# Patient Record
Sex: Female | Born: 1985 | Race: White | Hispanic: No | Marital: Single | Smoking: Current every day smoker
Health system: Southern US, Community
[De-identification: ages and names within clinical notes are randomized; demographics above are authoritative.]

## PROBLEM LIST (undated history)

## (undated) HISTORY — PX: ANKLE SURGERY: SHX546

---

## 2016-04-09 ENCOUNTER — Emergency Department (HOSPITAL_COMMUNITY)
Admission: EM | Admit: 2016-04-09 | Discharge: 2016-04-09 | Disposition: A | Discharge status: Home / Self Care | Payer: Self-pay | Attending: Emergency Medicine | Admitting: Emergency Medicine

## 2016-04-09 ENCOUNTER — Encounter (HOSPITAL_COMMUNITY): Payer: Self-pay

## 2016-04-09 DIAGNOSIS — Z711 Person with feared health complaint in whom no diagnosis is made: Secondary | ICD-10-CM | POA: Insufficient documentation

## 2016-04-09 NOTE — ED Provider Notes (Signed)
CSN: [id number]     Arrival date & time 04/09/16  1148 History   First MD Initiated Contact with Patient 04/09/16 1359     Chief Complaint  Patient presents with  . v74.5      (Consider location/radiation/quality/duration/timing/severity/associated sxs/prior Treatment) HPI Comments: Patient here after possible STI exposure. She is currently asymptomatic. She is here with her partner who was just seen in the department and treated for possible STI although he also doesn't have any symptoms. She denies any vaginal bleeding or discharge. No dysuria. No flank pain or abdominal discomfort. Her partner did have some lymphadenopathy however he denies any penile drainage or dysuria. He did have a urine STI panel sent.  The history is provided by the patient.    History reviewed. No pertinent past medical history. History reviewed. No pertinent past surgical history. No family history on file. Social History  Substance Use Topics  . Smoking status: Never Smoker   . Smokeless tobacco: None  . Alcohol Use: None   OB History    No data available     Review of Systems  All other systems reviewed and are negative.     Allergies  Review of patient's allergies indicates not on file.  Home Medications   Prior to Admission medications   Not on File   BP 115/67 mmHg  Pulse 75  Temp(Src) 98.2 F (36.8 C) (Oral)  Resp 18  SpO2 100%  LMP 04/09/2016 Physical Exam  Constitutional: She is oriented to person, place, and time. She appears well-developed and well-nourished.  Non-toxic appearance. No distress.  HENT:  Head: Normocephalic and atraumatic.  Eyes: Conjunctivae, EOM and lids are normal. Pupils are equal, round, and reactive to light.  Neck: Normal range of motion. Neck supple. No tracheal deviation present. No thyroid mass present.  Cardiovascular: Normal rate, regular rhythm and normal heart sounds.  Exam reveals no gallop.   No murmur heard. Pulmonary/Chest: Effort normal  and breath sounds normal. No stridor. No respiratory distress. She has no decreased breath sounds. She has no wheezes. She has no rhonchi. She has no rales.  Abdominal: Soft. Normal appearance and bowel sounds are normal. She exhibits no distension. There is no tenderness. There is no rebound and no CVA tenderness.  Musculoskeletal: Normal range of motion. She exhibits no edema or tenderness.  Neurological: She is alert and oriented to person, place, and time. She has normal strength. No cranial nerve deficit or sensory deficit. GCS eye subscore is 4. GCS verbal subscore is 5. GCS motor subscore is 6.  Skin: Skin is warm and dry. No abrasion and no rash noted.  Psychiatric: She has a normal mood and affect. Her speech is normal and behavior is normal.  Nursing note and vitals reviewed.   ED Course  Procedures (including critical care time) Labs Review Labs Reviewed - No data to display  Imaging Review No results found. I have personally reviewed and evaluated these images and lab results as part of my medical decision-making.   EKG Interpretation None      MDM   Final diagnoses:  None    Pelvic exam deferred per patient. Patient has no clinical indication for treatment of STI at this time. Will follow-up with her physician and given return precautions.    Lorre NickAnthony Ciji Boston, MD 04/09/16 1410

## 2016-04-09 NOTE — Discharge Instructions (Signed)
Follow-up in the health Department if you should start to have vaginal discharge, burning when you peak, or abdominal discomfort

## 2016-04-09 NOTE — ED Notes (Signed)
Patient here requesting tx for STD since her significant other was just treated. No complaints

## 2018-01-09 ENCOUNTER — Encounter (HOSPITAL_COMMUNITY): Payer: Self-pay | Admitting: Emergency Medicine

## 2018-01-09 ENCOUNTER — Other Ambulatory Visit: Payer: Self-pay

## 2018-01-09 ENCOUNTER — Ambulatory Visit (HOSPITAL_COMMUNITY)
Admission: EM | Admit: 2018-01-09 | Discharge: 2018-01-09 | Disposition: A | Discharge status: Home / Self Care | Payer: Managed Care, Other (non HMO) | Attending: Family Medicine | Admitting: Family Medicine

## 2018-01-09 DIAGNOSIS — R202 Paresthesia of skin: Secondary | ICD-10-CM | POA: Diagnosis not present

## 2018-01-09 MED ORDER — NAPROXEN 500 MG PO TABS: 500.0000 mg | ORAL_TABLET | Freq: Two times a day (BID) | ORAL | 0 refills | Status: AC

## 2018-01-09 NOTE — Discharge Instructions (Signed)
Naproxen twice a day, take with food. Ice of wrists after work. Splints to both wrists at least at night while sleeping, may be beneficial while working as well. Please follow up with occupational health. Please establish with a primary care provider as well as may need long term management or further evaluation if symptoms persist.

## 2018-01-09 NOTE — ED Provider Notes (Signed)
[facility]    CSN: [id number] Arrival date & time: 01/09/18  1328     History   Chief Complaint Chief Complaint  Patient presents with  . Hand Problem    HPI Stephanie Jacobson is a 32 y.o. female.   Stephanie Jacobson presents with complaints of bilateral hand tingling sensation. Started approximately 1 week ago. Worse at night. Affects thumbs, pointer finger, middle finger and left ring finger. Pinky is not involved. Palm at base of thumb with pain intermittently. Hands feel stiff. Without gross numbness. Work also exacerbates symptoms. Occasionally with pain that shoots up to left elbow. Without shoulder or neck pain. 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 Has taken aleve occasionally which sometimes helps, last dose last night. She is right handed. Does not have a pcp. Denies any previous similar.    ROS per HPI.       History reviewed. No pertinent past medical history.  There are no active problems to display for this patient.   Past Surgical History:  Procedure Laterality Date  . ANKLE SURGERY Left     OB History    No data available       Home Medications    Prior to Admission medications   Medication Sig Start Date End Date Taking? Authorizing Provider  naproxen (NAPROSYN) 500 MG tablet Take 1 tablet (500 mg total) by mouth 2 (two) times daily. 01/09/18   Georgetta HaberBurky, Tammey Deeg B, NP    Family History Family History  Problem Relation Age of Onset  . Hypertension Mother   . Diabetes Mother     Social History Social History   Tobacco Use  . Smoking status: Current Every Day Smoker  Substance Use Topics  . Alcohol use: Yes  . Drug use: No     Allergies   Patient has no known allergies.   Review of Systems Review of Systems   Physical Exam Triage Vital Signs ED Triage Vitals  Enc Vitals Group     BP 01/09/18 1406 (!) 105/55     Pulse Rate 01/09/18 1406 95      Resp 01/09/18 1406 18     Temp 01/09/18 1406 98.3 F (36.8 C)     Temp Source 01/09/18 1406 Oral     SpO2 01/09/18 1406 100 %     Weight --      Height --      Head Circumference --      Peak Flow --      Pain Score 01/09/18 1402 1     Pain Loc --      Pain Edu? --      Excl. in GC? --    No data found.  Updated Vital Signs BP (!) 105/55 (BP Location: Left Arm)   Pulse 95   Temp 98.3 F (36.8 C) (Oral)   Resp 18   LMP 12/12/2017   SpO2 100%   Visual Acuity Right Eye Distance:   Left Eye Distance:   Bilateral Distance:    Right Eye Near:   Left Eye Near:    Bilateral Near:     Physical Exam  Constitutional: She is oriented to person, place, and time. She appears well-developed and well-nourished. No distress.  Cardiovascular: Normal rate, regular rhythm and normal heart sounds.  Pulmonary/Chest: Effort normal and breath sounds normal.  Musculoskeletal:       Left hand: She exhibits normal range  of motion, no tenderness, no bony tenderness, normal two-point discrimination, normal capillary refill, no deformity, no laceration and no swelling. Decreased sensation noted. Decreased strength noted. She exhibits thumb/finger opposition.       Hands: Sensation of tingling as noted per diagram; gross sensation intact; moving all fingers and wrist; with making a fist patient describes a "tightness" to digits. Cap refill <2 sec; bilateral elbow ROM normal, non tender, full bilateral shoulder ROM  Neurological: She is alert and oriented to person, place, and time.  Skin: Skin is warm and dry.     UC Treatments / Results  Labs (all labs ordered are listed, but only abnormal results are displayed) Labs Reviewed - No data to display  EKG  EKG Interpretation None       Radiology No results found.  Procedures Procedures (including critical care time)  Medications Ordered in UC Medications - No data to display   Initial Impression / Assessment and Plan / UC  Course  I have reviewed the triage vital signs and the nursing notes.  Pertinent labs & imaging results that were available during my care of the patient were reviewed by me and considered in my medical decision making (see chart for details).     Works an Theatre stage manager style work at [employer], likely contributing to symptoms. Concern for carpal tunnel. Bilateral wrist splints provided, encouraged night time use, may need to use during work as well. Naproxen twice a day. Ice application. Follow up with occupational health. Establish with a PCP as may need long term management or further evaluation if symptoms persist.   Final Clinical Impressions(s) / UC Diagnoses   Final diagnoses:  Paresthesia of both hands    ED Discharge Orders        Ordered    naproxen (NAPROSYN) 500 MG tablet  2 times daily     01/09/18 1434       Controlled Substance Prescriptions [place] Controlled Substance Registry consulted? Not Applicable   Georgetta Haber, NP 01/09/18 1444

## 2018-01-09 NOTE — ED Triage Notes (Signed)
Onset 1/21 of bilateral hand issues.  Both hands with tingling sensation in fingertips. Left arm has sharp, shooting pains.  Patient feels bilateral weakness in both hands.  Patient is right handed.  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

## 2018-01-17 ENCOUNTER — Encounter (HOSPITAL_COMMUNITY): Payer: Self-pay | Admitting: Emergency Medicine

## 2018-01-17 ENCOUNTER — Emergency Department (HOSPITAL_COMMUNITY)
Admission: EM | Admit: 2018-01-17 | Discharge: 2018-01-17 | Disposition: A | Discharge status: Home / Self Care | Payer: Managed Care, Other (non HMO) | Attending: Emergency Medicine | Admitting: Emergency Medicine

## 2018-01-17 ENCOUNTER — Emergency Department (HOSPITAL_COMMUNITY): Payer: Managed Care, Other (non HMO)

## 2018-01-17 DIAGNOSIS — R200 Anesthesia of skin: Secondary | ICD-10-CM | POA: Diagnosis not present

## 2018-01-17 DIAGNOSIS — F17200 Nicotine dependence, unspecified, uncomplicated: Secondary | ICD-10-CM | POA: Diagnosis not present

## 2018-01-17 DIAGNOSIS — M79641 Pain in right hand: Secondary | ICD-10-CM | POA: Diagnosis present

## 2018-01-17 DIAGNOSIS — M79642 Pain in left hand: Secondary | ICD-10-CM | POA: Insufficient documentation

## 2018-01-17 MED ORDER — PREDNISONE 50 MG PO TABS: ORAL_TABLET | ORAL | 0 refills | Status: AC

## 2018-01-17 MED ORDER — CYCLOBENZAPRINE HCL 10 MG PO TABS: 10.0000 mg | ORAL_TABLET | Freq: Every evening | ORAL | 0 refills | Status: AC | PRN

## 2018-01-17 NOTE — ED Triage Notes (Signed)
Pt reports bilateral hand swelling and numbness for the past 3 weeks. Onset has been gradual. No acute injury.

## 2018-01-17 NOTE — Discharge Instructions (Signed)
Wear the wrist splints for comfort and support. Follow up for further evaluation. Do not take the muscle relaxer when doing anything that may cause injury because they will make you sleepy.

## 2018-01-17 NOTE — ED Provider Notes (Signed)
[facility]EMERGENCY [facility]PT Provider Note   CSN: [id number] Arrival date & time: 01/17/18  1030     History   Chief Complaint Chief Complaint  Patient presents with  . Hand Pain    HPI Stephanie Jacobson is a 32 y.o. female who presents to the ED with bilateral hand pain that started over 2 weeks ago. Patient reports going to Urgent Care and treated with wrist splints and given referral to ortho. Patient reports being unable to get an appointment. She is here today c/o worsening symptoms. Patient is taking Naprosyn without relief and states that the splint is to big and makes things worse. 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 She reports that in the morning when she get up her hands are swollen.   HPI  History reviewed. No pertinent past medical history.  There are no active problems to display for this patient.   Past Surgical History:  Procedure Laterality Date  . ANKLE SURGERY Left     OB History    No data available       Home Medications    Prior to Admission medications   Medication Sig Start Date End Date Taking? Authorizing Provider  cyclobenzaprine (FLEXERIL) 10 MG tablet Take 1 tablet (10 mg total) by mouth at bedtime as needed for muscle spasms. 01/17/18   Janne NapoleonNeese, Hope M, NP  naproxen (NAPROSYN) 500 MG tablet Take 1 tablet (500 mg total) by mouth 2 (two) times daily. 01/09/18   Georgetta HaberBurky, Natalie B, NP  predniSONE (DELTASONE) 50 MG tablet Take one tablet PO daily 01/17/18   Janne NapoleonNeese, Hope M, NP    Family History Family History  Problem Relation Age of Onset  . Hypertension Mother   . Diabetes Mother     Social History Social History   Tobacco Use  . Smoking status: Current Every Day Smoker  Substance Use Topics  . Alcohol use: Yes  . Drug use: No     Allergies   Patient has no known allergies.   Review of Systems Review of Systems  Musculoskeletal: Positive for arthralgias and joint  swelling.  Skin: Negative for wound.  All other systems reviewed and are negative.    Physical Exam Updated Vital Signs BP 128/79 (BP Location: Left Arm)   Pulse 86   Temp 98 F (36.7 C) (Oral)   Resp 15   LMP 01/16/2018 (Exact Date)   SpO2 99%   Physical Exam  Constitutional: She appears well-developed and well-nourished. No distress.  HENT:  Head: Normocephalic.  Eyes: EOM are normal.  Neck: Neck supple.  Cardiovascular: Normal rate.  Pulmonary/Chest: Effort normal.  Musculoskeletal:       Right hand: She exhibits tenderness. She exhibits normal range of motion, normal capillary refill, no deformity, no laceration and no swelling. Normal sensation noted. Normal strength noted. She exhibits no thumb/finger opposition.       Left hand: She exhibits tenderness. She exhibits normal range of motion, normal capillary refill, no deformity, no laceration and no swelling. Normal sensation noted. Normal strength noted. She exhibits no thumb/finger opposition.  Radial pulses 2+, adequate circulation, grips are equal. Tender with palpation to the dorsum of the the hands that radiates to the wrist.   Neurological: She is alert.  Skin: Skin is warm and dry.  Psychiatric: She has a normal mood and affect. Her behavior is normal.  Nursing note and vitals reviewed.    ED Treatments / Results  Labs (  all labs ordered are listed, but only abnormal results are displayed) Labs Reviewed - No data to display   Radiology Dg Wrist Complete Right  Result Date: 01/17/2018 CLINICAL DATA:  Bilateral hand pain and numbness for 3 weeks without known injury. EXAM: RIGHT WRIST - COMPLETE 3+ VIEW COMPARISON:  None. FINDINGS: There is no evidence of fracture or dislocation. There is no evidence of arthropathy or other focal bone abnormality. Soft tissues are unremarkable. IMPRESSION: Normal right wrist. Electronically Signed   By: Lupita Raider, M.D.   On: 01/17/2018 16:16    Procedures Procedures  (including critical care time)  Medications Ordered in ED Medications - No data to display   Initial Impression / Assessment and Plan / ED Course  I have reviewed the triage vital signs and the nursing notes. 32 y.o. female with bilateral hand pain that radiates to the wrist and forearm stable for d/c without focal neuro deficits. Patient given wrist splints while in the ED and states that her pain has already improved. Will treat with short course of steroids and patient to f/u with ortho for further evaluation.   Final Clinical Impressions(s) / ED Diagnoses   Final diagnoses:  Bilateral hand pain    ED Discharge Orders        Ordered    predniSONE (DELTASONE) 50 MG tablet     01/17/18 1631    cyclobenzaprine (FLEXERIL) 10 MG tablet  At bedtime PRN     01/17/18 1631       Damian Leavell [place], NP 01/17/18 2251    Bethann Berkshire, MD 01/18/18 1256

## 2019-09-07 IMAGING — DX DG WRIST COMPLETE 3+V*R*
4 series · 4 of 4 positions shown · non-contrast
Comparison: None.

CLINICAL DATA: Bilateral hand pain and numbness for 3 weeks without
known injury.

EXAM:
RIGHT WRIST - COMPLETE 3+ VIEW

[wrist ap (1 of 2)]
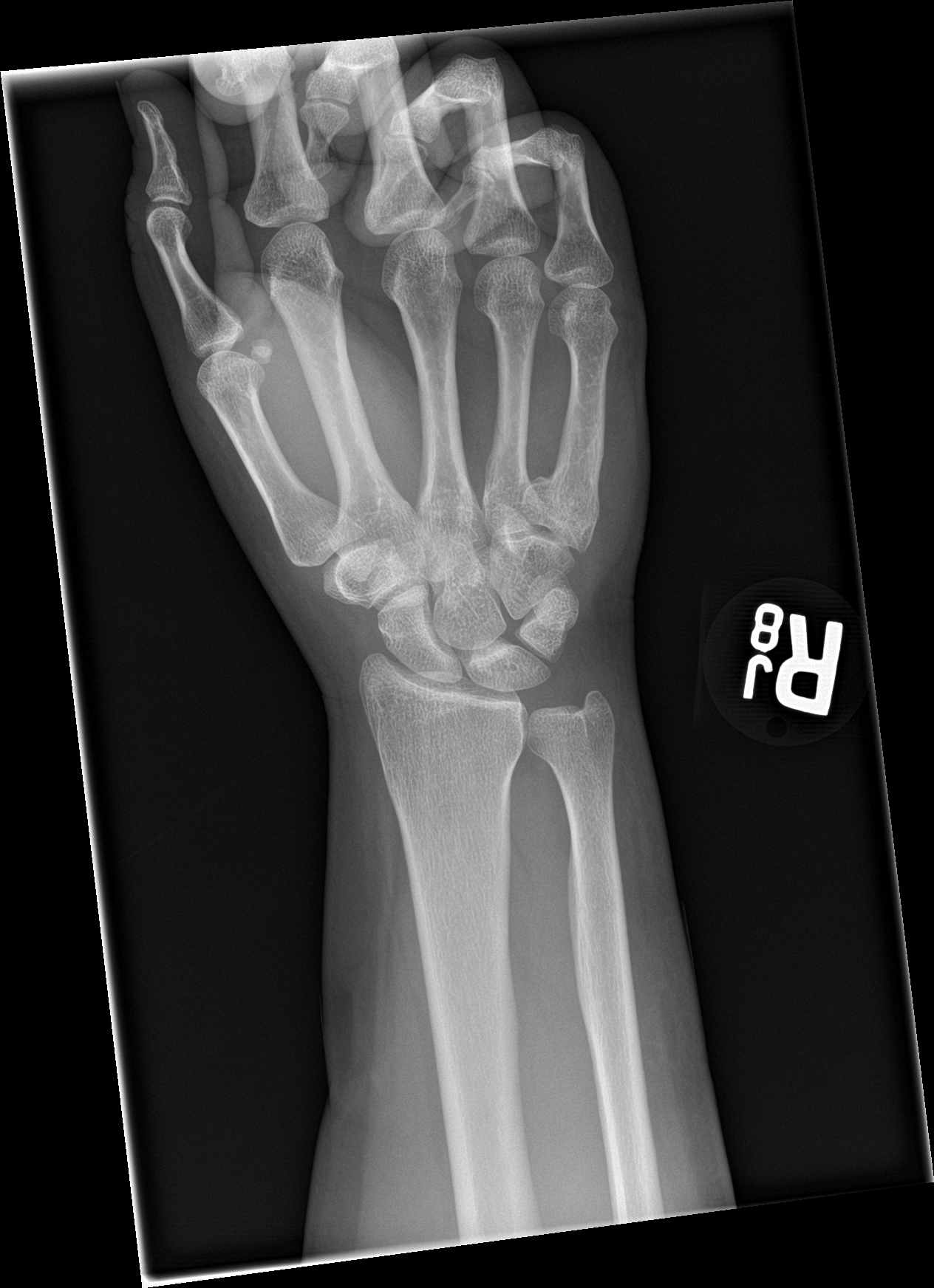

[wrist obl]
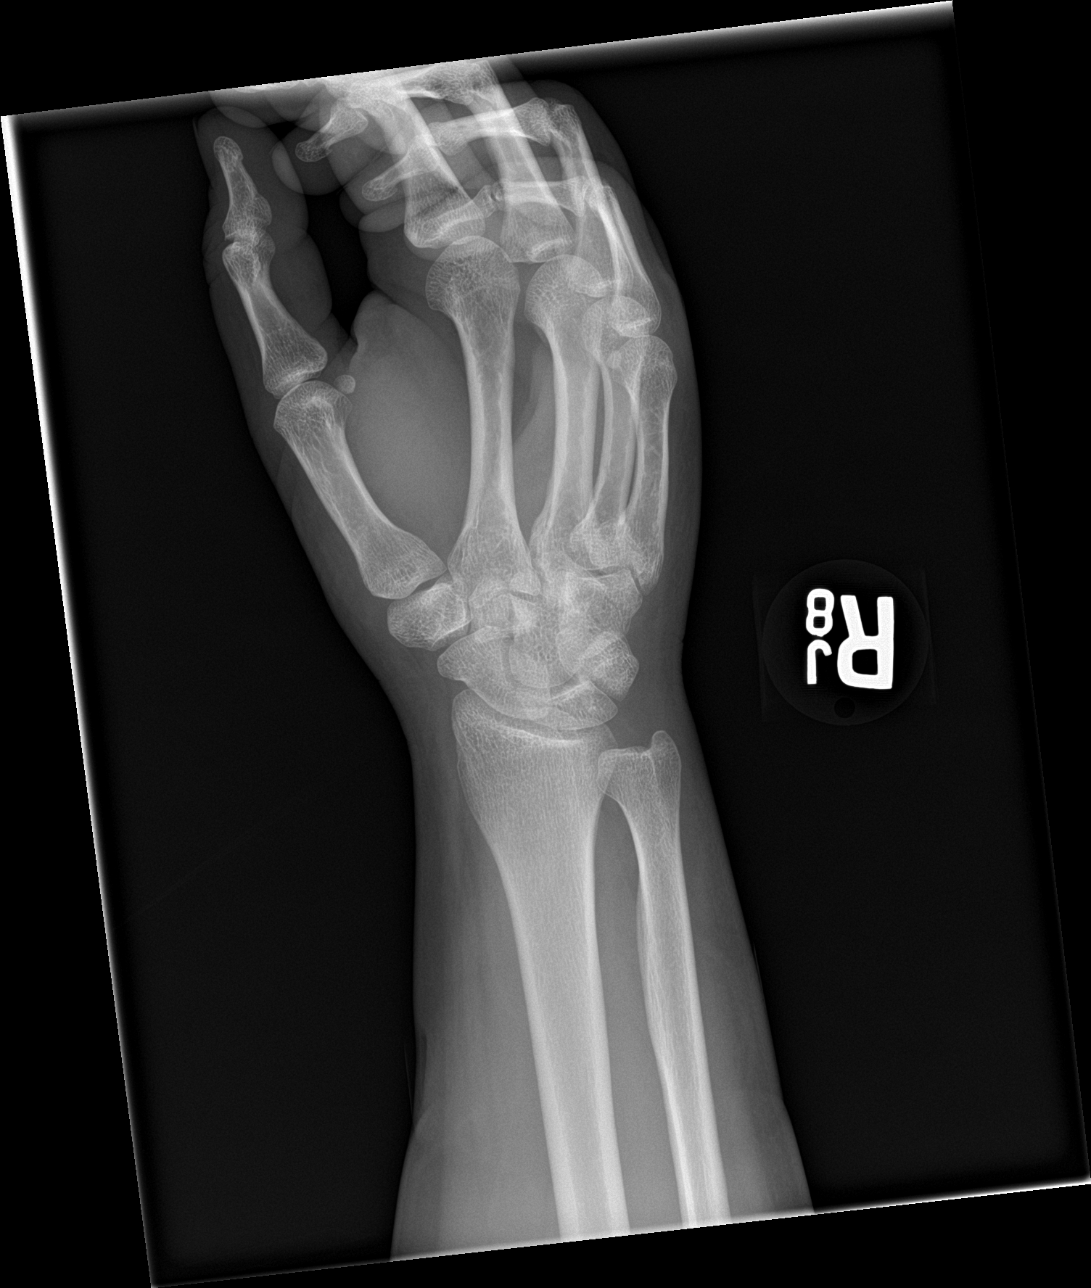

[wrist lat]
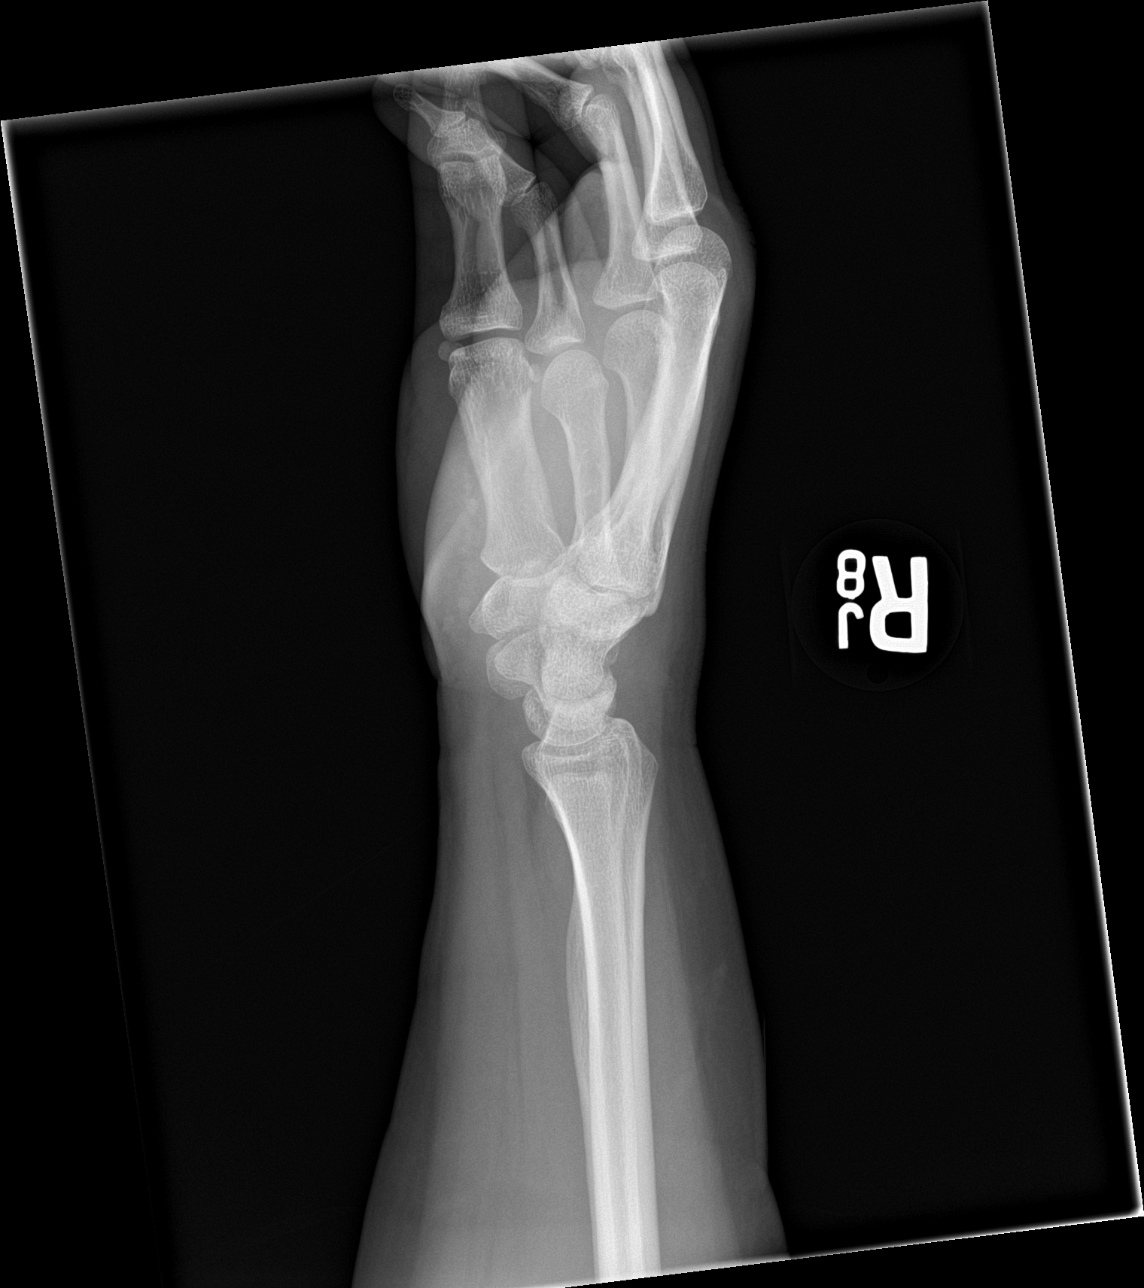

[wrist ap (2 of 2)]
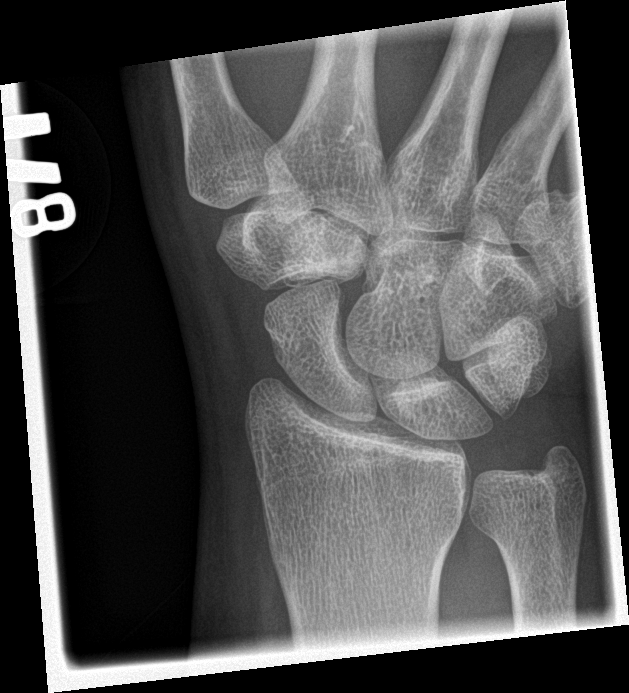

[4 of 4 positions shown; findings below may reference images not displayed]

FINDINGS: There is no evidence of fracture or dislocation. There is no
evidence of arthropathy or other focal bone abnormality. Soft
tissues are unremarkable.
IMPRESSION: Normal right wrist.

## 2022-11-11 DIAGNOSIS — Z419 Encounter for procedure for purposes other than remedying health state, unspecified: Secondary | ICD-10-CM | POA: Diagnosis not present

## 2022-12-12 DIAGNOSIS — Z419 Encounter for procedure for purposes other than remedying health state, unspecified: Secondary | ICD-10-CM | POA: Diagnosis not present

## 2023-01-12 DIAGNOSIS — Z419 Encounter for procedure for purposes other than remedying health state, unspecified: Secondary | ICD-10-CM | POA: Diagnosis not present

## 2023-02-10 DIAGNOSIS — Z419 Encounter for procedure for purposes other than remedying health state, unspecified: Secondary | ICD-10-CM | POA: Diagnosis not present

## 2023-03-13 DIAGNOSIS — Z419 Encounter for procedure for purposes other than remedying health state, unspecified: Secondary | ICD-10-CM | POA: Diagnosis not present

## 2023-04-12 DIAGNOSIS — Z419 Encounter for procedure for purposes other than remedying health state, unspecified: Secondary | ICD-10-CM | POA: Diagnosis not present

## 2023-05-13 DIAGNOSIS — Z419 Encounter for procedure for purposes other than remedying health state, unspecified: Secondary | ICD-10-CM | POA: Diagnosis not present

## 2023-06-12 DIAGNOSIS — Z419 Encounter for procedure for purposes other than remedying health state, unspecified: Secondary | ICD-10-CM | POA: Diagnosis not present

## 2023-07-13 DIAGNOSIS — Z419 Encounter for procedure for purposes other than remedying health state, unspecified: Secondary | ICD-10-CM | POA: Diagnosis not present

## 2023-08-13 DIAGNOSIS — Z419 Encounter for procedure for purposes other than remedying health state, unspecified: Secondary | ICD-10-CM | POA: Diagnosis not present

## 2023-09-12 DIAGNOSIS — Z419 Encounter for procedure for purposes other than remedying health state, unspecified: Secondary | ICD-10-CM | POA: Diagnosis not present

## 2023-10-13 DIAGNOSIS — Z419 Encounter for procedure for purposes other than remedying health state, unspecified: Secondary | ICD-10-CM | POA: Diagnosis not present

## 2023-11-12 DIAGNOSIS — Z419 Encounter for procedure for purposes other than remedying health state, unspecified: Secondary | ICD-10-CM | POA: Diagnosis not present

## 2023-12-13 DIAGNOSIS — Z419 Encounter for procedure for purposes other than remedying health state, unspecified: Secondary | ICD-10-CM | POA: Diagnosis not present

## 2024-01-13 DIAGNOSIS — Z419 Encounter for procedure for purposes other than remedying health state, unspecified: Secondary | ICD-10-CM | POA: Diagnosis not present

## 2024-02-10 DIAGNOSIS — Z419 Encounter for procedure for purposes other than remedying health state, unspecified: Secondary | ICD-10-CM | POA: Diagnosis not present

## 2024-03-23 DIAGNOSIS — Z419 Encounter for procedure for purposes other than remedying health state, unspecified: Secondary | ICD-10-CM | POA: Diagnosis not present

## 2024-04-22 DIAGNOSIS — Z419 Encounter for procedure for purposes other than remedying health state, unspecified: Secondary | ICD-10-CM | POA: Diagnosis not present

## 2024-05-23 DIAGNOSIS — Z419 Encounter for procedure for purposes other than remedying health state, unspecified: Secondary | ICD-10-CM | POA: Diagnosis not present

## 2024-06-22 DIAGNOSIS — Z419 Encounter for procedure for purposes other than remedying health state, unspecified: Secondary | ICD-10-CM | POA: Diagnosis not present

## 2024-07-23 DIAGNOSIS — Z419 Encounter for procedure for purposes other than remedying health state, unspecified: Secondary | ICD-10-CM | POA: Diagnosis not present

## 2024-08-23 DIAGNOSIS — Z419 Encounter for procedure for purposes other than remedying health state, unspecified: Secondary | ICD-10-CM | POA: Diagnosis not present
